# Patient Record
Sex: Male | Born: 1970 | Race: White | Hispanic: No | Marital: Married | Smoking: Never smoker
Health system: Southern US, Community
[De-identification: ages and names within clinical notes are randomized; demographics above are authoritative.]

## PROBLEM LIST (undated history)

## (undated) DIAGNOSIS — T7840XA Allergy, unspecified, initial encounter: Secondary | ICD-10-CM

## (undated) HISTORY — DX: Allergy, unspecified, initial encounter: T78.40XA

---

## 2011-01-13 ENCOUNTER — Encounter: Payer: Self-pay | Admitting: Family Medicine

## 2011-01-13 ENCOUNTER — Ambulatory Visit (INDEPENDENT_AMBULATORY_CARE_PROVIDER_SITE_OTHER): Payer: BC Managed Care – PPO | Admitting: Family Medicine

## 2011-01-13 VITALS — BP 120/82 | HR 74 | Ht 68.0 in | Wt 203.0 lb

## 2011-01-13 DIAGNOSIS — J309 Allergic rhinitis, unspecified: Secondary | ICD-10-CM | POA: Insufficient documentation

## 2011-01-13 DIAGNOSIS — Z0000 Encounter for general adult medical examination without abnormal findings: Secondary | ICD-10-CM

## 2011-01-13 DIAGNOSIS — Z23 Encounter for immunization: Secondary | ICD-10-CM

## 2011-01-13 LAB — CBC WITH DIFFERENTIAL/PLATELET
Basophils Absolute: 0 10*3/uL (ref 0.0–0.1)
Basophils Relative: 0 % (ref 0–1)
MCHC: 35 g/dL (ref 30.0–36.0)
Monocytes Absolute: 0.4 10*3/uL (ref 0.1–1.0)
Neutro Abs: 2.7 10*3/uL (ref 1.7–7.7)
Neutrophils Relative %: 56 % (ref 43–77)
RDW: 12.7 % (ref 11.5–15.5)

## 2011-01-13 LAB — LIPID PANEL
HDL: 46 mg/dL (ref >39)
LDL Cholesterol: 127 mg/dL — ABNORMAL HIGH (ref 0–99)
Total CHOL/HDL Ratio: 4.1 Ratio
VLDL: 15 mg/dL (ref 0–40)

## 2011-01-13 LAB — POCT URINALYSIS DIPSTICK
Leukocytes, UA: NEGATIVE
Nitrite, UA: NEGATIVE
Protein, UA: NEGATIVE
pH, UA: 5

## 2011-01-13 LAB — COMPREHENSIVE METABOLIC PANEL
AST: 26 U/L (ref 0–37)
Albumin: 4.7 g/dL (ref 3.5–5.2)
Alkaline Phosphatase: 59 U/L (ref 39–117)
BUN: 17 mg/dL (ref 6–23)
Potassium: 4.4 mEq/L (ref 3.5–5.3)
Total Bilirubin: 0.6 mg/dL (ref 0.3–1.2)

## 2011-01-13 NOTE — Progress Notes (Signed)
  Subjective:    Patient ID: Keith Andrade, male    DOB: June 13, 1970, 40 y.o.   MRN: [id number]  HPI He is here for a complete examination. He moved to this area within the last year. 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 He does have spring allergies and takes care of this with OTC medications. He has no particular concerns or complaints.   Review of Systems  Constitutional: Negative.   HENT: Negative.   Eyes: Negative.   Respiratory: Negative.   Cardiovascular: Negative.   Gastrointestinal: Negative.   Genitourinary: Negative.   Musculoskeletal: Negative.   Skin: Negative.   Neurological: Negative.   Hematological: Negative.   Psychiatric/Behavioral: Negative.        Objective:   Physical Exam BP 120/82  Pulse 74  Ht 5\' 8"  (1.727 m)  Wt 203 lb (92.08 kg)  BMI 30.87 kg/m2  General Appearance:    Alert, cooperative, no distress, appears stated age  Head:    Normocephalic, without obvious abnormality, atraumatic  Eyes:    PERRL, conjunctiva/corneas clear, EOM's intact, fundi    benign  Ears:    Normal TM's and external ear canals  Nose:   Nares normal, mucosa normal, no drainage or sinus   tenderness  Throat:   Lips, mucosa, and tongue normal; teeth and gums normal  Neck:   Supple, no lymphadenopathy;  thyroid:  no   enlargement/tenderness/nodules; no carotid   bruit or JVD  Back:    Spine nontender, no curvature, ROM normal, no CVA     tenderness  Lungs:     Clear to auscultation bilaterally without wheezes, rales or     ronchi; respirations unlabored  Chest Wall:    No tenderness or deformity   Heart:    Regular rate and rhythm, S1 and S2 normal, no murmur, rub   or gallop  Breast Exam:    No chest wall tenderness, masses or gynecomastia  Abdomen:     Soft, non-tender, nondistended, normoactive bowel sounds,    no masses, no hepatosplenomegaly  Genitalia:    Normal male external genitalia without lesions.  Testicles without masses.  No inguinal hernias.  Rectal:    Normal  sphincter tone, no masses or tenderness; guaiac negative stool.  Prostate smooth, no nodules, not enlarged.  Extremities:   No clubbing, cyanosis or edema  Pulses:   2+ and symmetric all extremities  Skin:   Skin color, texture, turgor normal, no rashes or lesions  Lymph nodes:   Cervical, supraclavicular, and axillary nodes normal  Neurologic:   CNII-XII intact, normal strength, sensation and gait; reflexes 2+ and symmetric throughout          Psych:   Normal mood, affect, hygiene and grooming.    EKG is normal       Assessment & Plan:   1. Physical exam, annual  POCT Urinalysis Dipstick, CBC with Differential, Comprehensive metabolic panel, Lipid panel, Flu vaccine greater than or equal to 3yo with preservative IM, Tdap vaccine greater than or equal to 7yo IM, PR ELECTROCARDIOGRAM, COMPLETE, POCT Hemoccult (POC) Blood/Stool Test  2. Allergic rhinitis, mild     encouraged him to continue to take good care of himself.

## 2011-09-29 ENCOUNTER — Ambulatory Visit
Admission: RE | Admit: 2011-09-29 | Discharge: 2011-09-29 | Disposition: A | Discharge status: Home / Self Care | Payer: BC Managed Care – PPO | Source: Ambulatory Visit | Attending: Family Medicine | Admitting: Family Medicine

## 2011-09-29 ENCOUNTER — Encounter: Payer: Self-pay | Admitting: Family Medicine

## 2011-09-29 ENCOUNTER — Ambulatory Visit (INDEPENDENT_AMBULATORY_CARE_PROVIDER_SITE_OTHER): Payer: BC Managed Care – PPO | Admitting: Family Medicine

## 2011-09-29 VITALS — BP 124/80 | HR 66 | Wt 197.0 lb

## 2011-09-29 DIAGNOSIS — M25532 Pain in left wrist: Secondary | ICD-10-CM

## 2011-09-29 DIAGNOSIS — M25539 Pain in unspecified wrist: Secondary | ICD-10-CM

## 2011-09-29 DIAGNOSIS — M25519 Pain in unspecified shoulder: Secondary | ICD-10-CM

## 2011-09-29 DIAGNOSIS — M25512 Pain in left shoulder: Secondary | ICD-10-CM

## 2011-09-29 NOTE — Progress Notes (Signed)
  Subjective:    Patient ID: Keith Andrade, male    DOB: 03/11/1971, 41 y.o.   MRN: [id number]  HPI Several months ago when he was pulling carpeting he felt a loud pop and pain in his left wrist near the ulnar styloid. He experienced pain for several more weeks. He is continuing to have difficulty with this. He is right-handed but it is interfering with his ability to function and to play golf. He also complains of left anterior shoulder pain. He has no history of injury but did play high school football. He does feel a popping sensation anteriorly.   Review of Systems     Objective:   Physical Exam Left shoulder exam does show limitation of motion especially with abduction and external rotation. Neer's and Hawkins did cause a clicking sensation. He has a questionable positive sulcus sign but no anterior laxity noted. Light tenderness over a.c. Joint. Wrist exam shows full motion with no tenderness and normal strength. X-ray of the shoulder and wrist negative       Assessment & Plan:   1. Left anterior shoulder pain  DG Shoulder Left  2. Left wrist pain  DG Wrist Complete Left   discussed the fact that both of these could easily be surgical in nature and will refer to orthopedics. 25 minutes spent discussing all these issues with him.

## 2011-10-05 ENCOUNTER — Other Ambulatory Visit: Payer: Self-pay | Admitting: Orthopedic Surgery

## 2011-10-05 DIAGNOSIS — M25532 Pain in left wrist: Secondary | ICD-10-CM

## 2011-10-05 DIAGNOSIS — R531 Weakness: Secondary | ICD-10-CM

## 2011-10-11 ENCOUNTER — Other Ambulatory Visit: Payer: BC Managed Care – PPO

## 2011-10-16 ENCOUNTER — Ambulatory Visit
Admission: RE | Admit: 2011-10-16 | Discharge: 2011-10-16 | Disposition: A | Discharge status: Home / Self Care | Payer: BC Managed Care – PPO | Source: Ambulatory Visit | Attending: Orthopedic Surgery | Admitting: Orthopedic Surgery

## 2011-10-16 DIAGNOSIS — M25532 Pain in left wrist: Secondary | ICD-10-CM

## 2011-10-16 DIAGNOSIS — R531 Weakness: Secondary | ICD-10-CM

## 2011-10-16 MED ORDER — IOHEXOL 180 MG/ML  SOLN
1.0000 mL | Freq: Once | INTRAMUSCULAR | Status: AC | PRN
  Administered 2011-10-16: 1 mL via INTRA_ARTICULAR

## 2012-02-23 ENCOUNTER — Encounter: Payer: Self-pay | Admitting: Medical

## 2012-02-23 ENCOUNTER — Ambulatory Visit (INDEPENDENT_AMBULATORY_CARE_PROVIDER_SITE_OTHER): Payer: BC Managed Care – PPO | Admitting: Medical

## 2012-02-23 VITALS — BP 120/80 | Temp 98.3°F | Wt 203.0 lb

## 2012-02-23 DIAGNOSIS — J069 Acute upper respiratory infection, unspecified: Secondary | ICD-10-CM

## 2012-02-23 MED ORDER — AMOXICILLIN 875 MG PO TABS: 875.0000 mg | ORAL_TABLET | Freq: Two times a day (BID) | ORAL | Status: DC

## 2012-02-23 NOTE — Progress Notes (Signed)
Subjective: Here for cough.  Cough 4-5 days, nasal congestion, mild sore throat, coughing thick green morning, flecks of red from sinus drainage.  Using some dayquil with relief.  Denies fever, NVD, ear pain, body aches, chills.  +sick contacts at work.  Rarely sick.  No other aggravating or relieving factors.  No other c/o.   Objective:   Filed Vitals:   02/23/12 1434  BP: 120/80  Temp: 98.3 F (36.8 C)    General appearance: Alert, WD/WN, no distress                             Skin: warm, no rash                           Head: no sinus tenderness                            Eyes: conjunctiva normal, corneas clear, PERRLA                            Ears: pearly TMs, external ear canals normal                          Nose: septum midline, turbinates swollen, with erythema and clear discharge             Mouth/throat: MMM, tongue normal, mild pharyngeal erythema                           Neck: supple, no adenopathy, no thyromegaly, nontender                          Heart: RRR, normal S1, S2, no murmurs                         Lungs: CTA bilaterally, no wheezes, rales, or rhonchi     Assessment and Plan:   Encounter Diagnosis  Name Primary?  . URI (upper respiratory infection) Yes    Discussed diagnosis and treatment of what appears to viral URI, possible early sinusitis.  Suggested symptomatic OTC remedies. Nasal saline spray for congestion.  Tylenol or Ibuprofen OTC for fever and malaise.  If fever begins of if worse by Sunday, then can begin Amoxicillin.   Gave printed script and instructions.  Otherwise c/t to treat this as viral.  Call/return in 2-3 days if symptoms aren't resolving.

## 2012-02-24 ENCOUNTER — Encounter: Payer: Self-pay | Admitting: Medical

## 2013-02-04 ENCOUNTER — Telehealth: Payer: Self-pay | Admitting: Family Medicine

## 2013-02-04 NOTE — Telephone Encounter (Signed)
Called and left pt message jcl wanted him to come in Thursday 02/06/13 i left message of appointment of 9;30 AM IF HE COULD NOT MAKE IT TO [facility] CALL

## 2013-02-04 NOTE — Telephone Encounter (Signed)
,   Not sure what's going on but I doubt it's anything to be worried about. Schedule him to be seen Thursday

## 2013-02-06 ENCOUNTER — Ambulatory Visit: Payer: Self-pay | Admitting: Family Medicine

## 2013-02-07 ENCOUNTER — Encounter: Payer: Self-pay | Admitting: Family Medicine

## 2013-03-07 ENCOUNTER — Encounter: Payer: Self-pay | Admitting: Family Medicine

## 2013-03-07 ENCOUNTER — Ambulatory Visit (INDEPENDENT_AMBULATORY_CARE_PROVIDER_SITE_OTHER): Payer: BC Managed Care – PPO | Admitting: Family Medicine

## 2013-03-07 VITALS — BP 128/82 | HR 83 | Ht 68.5 in | Wt 196.0 lb

## 2013-03-07 DIAGNOSIS — Z0000 Encounter for general adult medical examination without abnormal findings: Secondary | ICD-10-CM

## 2013-03-07 DIAGNOSIS — Z23 Encounter for immunization: Secondary | ICD-10-CM

## 2013-03-07 LAB — POCT URINALYSIS DIPSTICK
Bilirubin, UA: NEGATIVE
Blood, UA: NEGATIVE
Glucose, UA: NEGATIVE
Ketones, UA: NEGATIVE
Leukocytes, UA: NEGATIVE
Nitrite, UA: NEGATIVE
pH, UA: 5

## 2013-03-07 LAB — COMPREHENSIVE METABOLIC PANEL
Albumin: 4.5 g/dL (ref 3.5–5.2)
BUN: 18 mg/dL (ref 6–23)
CO2: 28 mEq/L (ref 19–32)
Calcium: 9.3 mg/dL (ref 8.4–10.5)
Chloride: 100 mEq/L (ref 96–112)
Glucose, Bld: 91 mg/dL (ref 70–99)
Potassium: 4.5 mEq/L (ref 3.5–5.3)
Sodium: 138 mEq/L (ref 135–145)
Total Protein: 7 g/dL (ref 6.0–8.3)

## 2013-03-07 LAB — HEMOCCULT GUIAC POC 1CARD (OFFICE)

## 2013-03-07 LAB — CBC WITH DIFFERENTIAL/PLATELET
HCT: 43.8 % (ref 39.0–52.0)
Hemoglobin: 15.4 g/dL (ref 13.0–17.0)
Lymphs Abs: 1.5 10*3/uL (ref 0.7–4.0)
Monocytes Absolute: 0.5 10*3/uL (ref 0.1–1.0)
Monocytes Relative: 10 % (ref 3–12)
Neutro Abs: 2.6 10*3/uL (ref 1.7–7.7)
Neutrophils Relative %: 55 % (ref 43–77)
RBC: 4.92 MIL/uL (ref 4.22–5.81)

## 2013-03-07 LAB — LIPID PANEL
Cholesterol: 189 mg/dL (ref 0–200)
HDL: 47 mg/dL (ref >39)
LDL Cholesterol: 115 mg/dL — ABNORMAL HIGH (ref 0–99)
Triglycerides: 137 mg/dL (ref <150)

## 2013-03-07 NOTE — Progress Notes (Signed)
   Subjective:    Patient ID: Keith Andrade, male    DOB: May 01, 1970, 42 y.o.   MRN: [id number]  HPI He is here for complete examination. He has no particular concerns or questions. His past medical history is negative except for allergies. He is married with 2 sons. His work is going well. Family and social history were reviewed. He does exercise fairly regularly. Review of Systems  Constitutional: Negative.   HENT: Negative.   Eyes: Negative.   Respiratory: Negative.   Cardiovascular: Negative.   Gastrointestinal: Negative.   Endocrine: Negative.   Genitourinary: Negative.   Allergic/Immunologic: Negative.   Neurological: Negative.        Objective:   Physical Exam BP 128/82  Pulse 83  Ht 5' 8.5" (1.74 m)  Wt 196 lb (88.905 kg)  BMI 29.36 kg/m2  SpO2 99%  General Appearance:    Alert, cooperative, no distress, appears stated age  Head:    Normocephalic, without obvious abnormality, atraumatic  Eyes:    PERRL, conjunctiva/corneas clear, EOM's intact, fundi    benign  Ears:    Normal TM's and external ear canals  Nose:   Nares normal, mucosa normal, no drainage or sinus   tenderness  Throat:   Lips, mucosa, and tongue normal; teeth and gums normal  Neck:   Supple, no lymphadenopathy;  thyroid:  no   enlargement/tenderness/nodules; no carotid   bruit or JVD  Back:    Spine nontender, no curvature, ROM normal, no CVA     tenderness  Lungs:     Clear to auscultation bilaterally without wheezes, rales or     ronchi; respirations unlabored  Chest Wall:    No tenderness or deformity   Heart:    Regular rate and rhythm, S1 and S2 normal, no murmur, rub   or gallop  Breast Exam:    No chest wall tenderness, masses or gynecomastia  Abdomen:     Soft, non-tender, nondistended, normoactive bowel sounds,    no masses, no hepatosplenomegaly  Genitalia:    Normal male external genitalia without lesions.  Testicles without masses.  No inguinal hernias.  Rectal:    Normal sphincter tone,  no masses or tenderness; guaiac negative stool.  Prostate smooth, no nodules, not enlarged.  Extremities:   No clubbing, cyanosis or edema  Pulses:   2+ and symmetric all extremities  Skin:   Skin color, texture, turgor normal, no rashes or lesions  Lymph nodes:   Cervical, supraclavicular, and axillary nodes normal  Neurologic:   CNII-XII intact, normal strength, sensation and gait; reflexes 2+ and symmetric throughout          Psych:   Normal mood, affect, hygiene and grooming.          Assessment & Plan:  Routine general medical examination at a health care facility - Plan: POCT Urinalysis Dipstick, CBC with Differential, Comprehensive metabolic panel, Lipid panel, Hemoccult - 1 Card (office)  Need for prophylactic vaccination and inoculation against influenza - Plan: Flu Vaccine QUAD 36+ mos PF IM (Fluarix)  I encouraged him to continue to take good care of himself.

## 2013-07-11 ENCOUNTER — Ambulatory Visit (INDEPENDENT_AMBULATORY_CARE_PROVIDER_SITE_OTHER): Payer: BC Managed Care – PPO | Admitting: Medical

## 2013-07-11 ENCOUNTER — Encounter: Payer: Self-pay | Admitting: Medical

## 2013-07-11 VITALS — BP 112/80 | HR 72 | Temp 98.3°F | Resp 14 | Wt 195.0 lb

## 2013-07-11 DIAGNOSIS — M20009 Unspecified deformity of unspecified finger(s): Secondary | ICD-10-CM

## 2013-07-11 MED ORDER — CEPHALEXIN 500 MG PO CAPS
500.0000 mg | ORAL_CAPSULE | Freq: Three times a day (TID) | ORAL | Status: DC
Start: 2013-07-11 — End: 2013-08-29

## 2013-07-11 MED ORDER — SILVER SULFADIAZINE 1 % EX CREA: 1.0000 "application " | TOPICAL_CREAM | Freq: Two times a day (BID) | CUTANEOUS | Status: DC

## 2013-07-11 NOTE — Progress Notes (Signed)
Subjective:   Keith Andrade is a 43 y.o. male who presents for evaluation of right index finger wound.  He has a tendency to pick at his cuticles, but he has picked at the right index finger to the point of cause scabbing which has progressed to the PIP over the last few weeks.  It will scab and get red, but not completely healing.  He denies anxiety, denies having a major problem but more of a habit with picking at cuticles, like a nervous tic but mild.   The finger is red, has some open wounds and wants it to heal. Tried Vaseline, other topically creams OTC but not healing.   No prior similar lesion this bad from picking.  No pus, no fever.  Has mild tenderness at the wound.  No other aggravating or relieving factors. No other complaint.  The following portions of the patient's history were reviewed and updated as appropriate: allergies, current medications, past family history, past medical history, past social history and problem list.  Review of Systems As in subjective above   Objective:   Gen: wd, wn, nad Skin: right index finger at PIP with well demarcated 3cm  Including area above and below PIP mostly dorsal and lateral sides of the joint with thickening scabbed skin, pink/red coloration, 2 linear cracks/open wounds, and thickened tissue appearance in general.  No obvious induration, fluctuance warmth or drainage.  No red streaking.   Finger/hand neurovascularly intact MSK: rihgt index finger PIP with mildly reduced ROM due to pain, no obvious swelling, mild pain to palpation of PIP, otherwise fingers/hand nontender, normal ROM, old healing scab of right middle finger cuticle present.   Assessment:   Encounter Diagnosis  Name Primary?  . Deformity, finger acquired Yes      Plan:   Discussed symptoms and exam findings.  Prescribed: Keflex antibiotic to cover for potential infection, begin Silvadene cream for wound healing, discussed wound care, recheck in one week

## 2013-08-29 ENCOUNTER — Encounter: Payer: Self-pay | Admitting: Family Medicine

## 2013-08-29 ENCOUNTER — Ambulatory Visit (INDEPENDENT_AMBULATORY_CARE_PROVIDER_SITE_OTHER): Payer: BC Managed Care – PPO | Admitting: Family Medicine

## 2013-08-29 VITALS — Wt 199.0 lb

## 2013-08-29 DIAGNOSIS — L259 Unspecified contact dermatitis, unspecified cause: Secondary | ICD-10-CM

## 2013-08-29 MED ORDER — TRIAMCINOLONE ACETONIDE 0.5 % EX CREA: 1.0000 "application " | TOPICAL_CREAM | Freq: Three times a day (TID) | CUTANEOUS | Status: DC

## 2013-08-29 NOTE — Progress Notes (Signed)
   Subjective:    Patient ID: Keith Andrade, male    DOB: 11/04/1970, 43 y.o.   MRN: [id number]  HPI He abraded the left flank area with clipping shears while doing yard work. Cause some superficial erythema and they cover the area with bandages. He then developed some erythema in the area of the tape.   Review of Systems     Objective:   Physical Exam Left flank area has well demarcated linear horizontal lesions with some clearing and then erythema in the pattern of the tape.       Assessment & Plan:  Contact dermatitis - Plan: triamcinolone cream (KENALOG) 0.5 %  assortment cool compresses and Benadryl at night. Recommend white cotton against the skin rather than putting any more tape on.

## 2013-08-29 NOTE — Patient Instructions (Signed)
You can use Benadryl at night; pill form. Cold washcloth also helps with itching

## 2013-10-05 IMAGING — CR DG WRIST COMPLETE 3+V*L*
4 series · 4 of 4 positions shown · non-contrast
Comparison: None.

CLINICAL DATA: Pain

LEFT WRIST - COMPLETE 3+ VIEW

[view not recorded (1 of 4)]
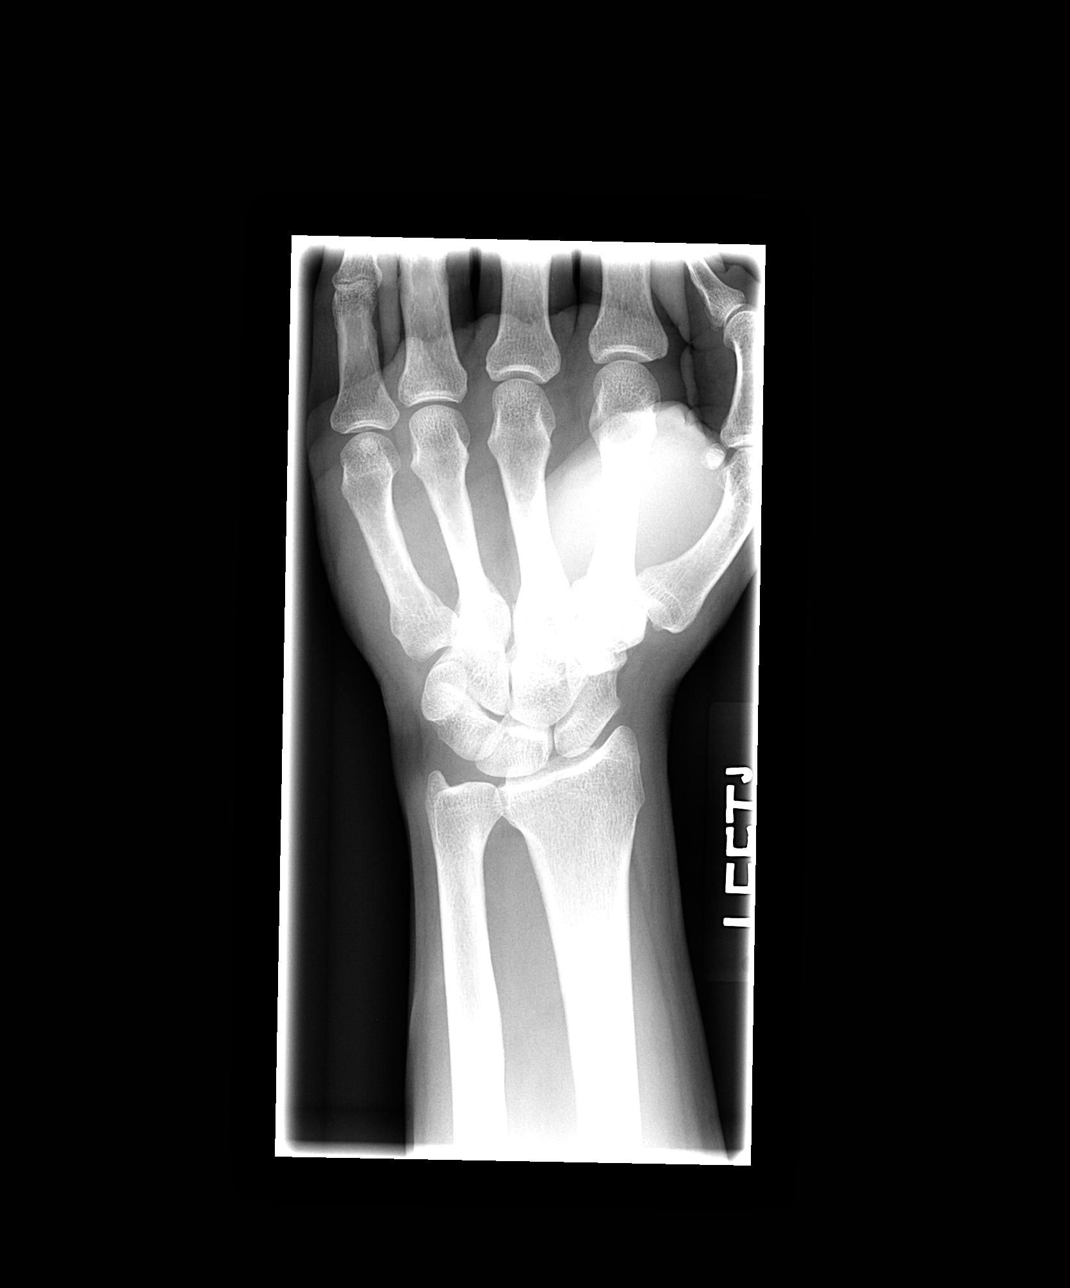

[view not recorded (2 of 4)]
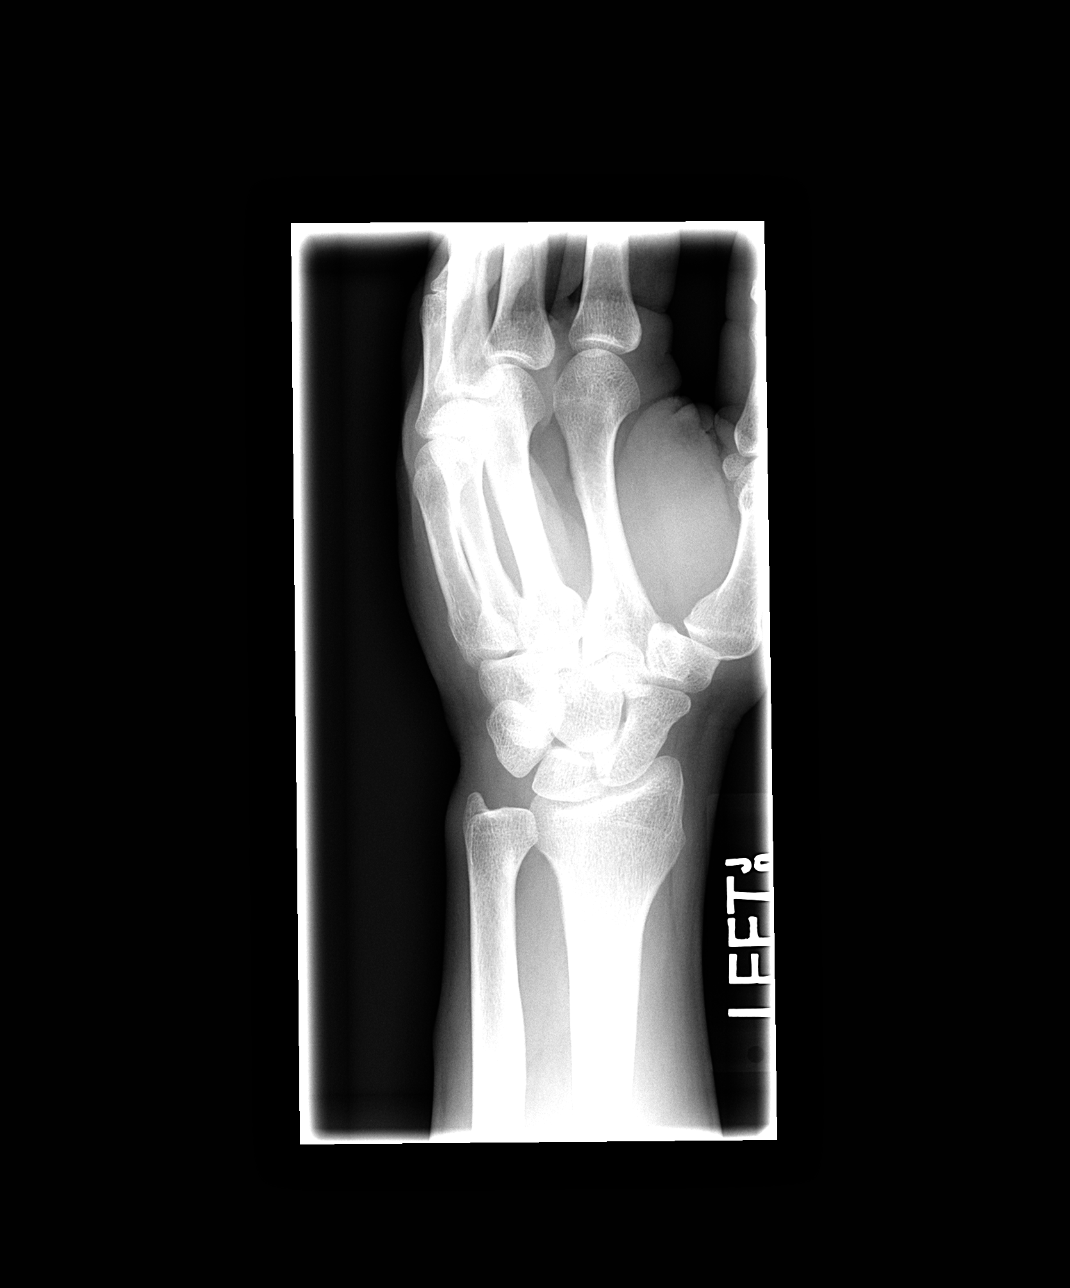

[view not recorded (3 of 4)]
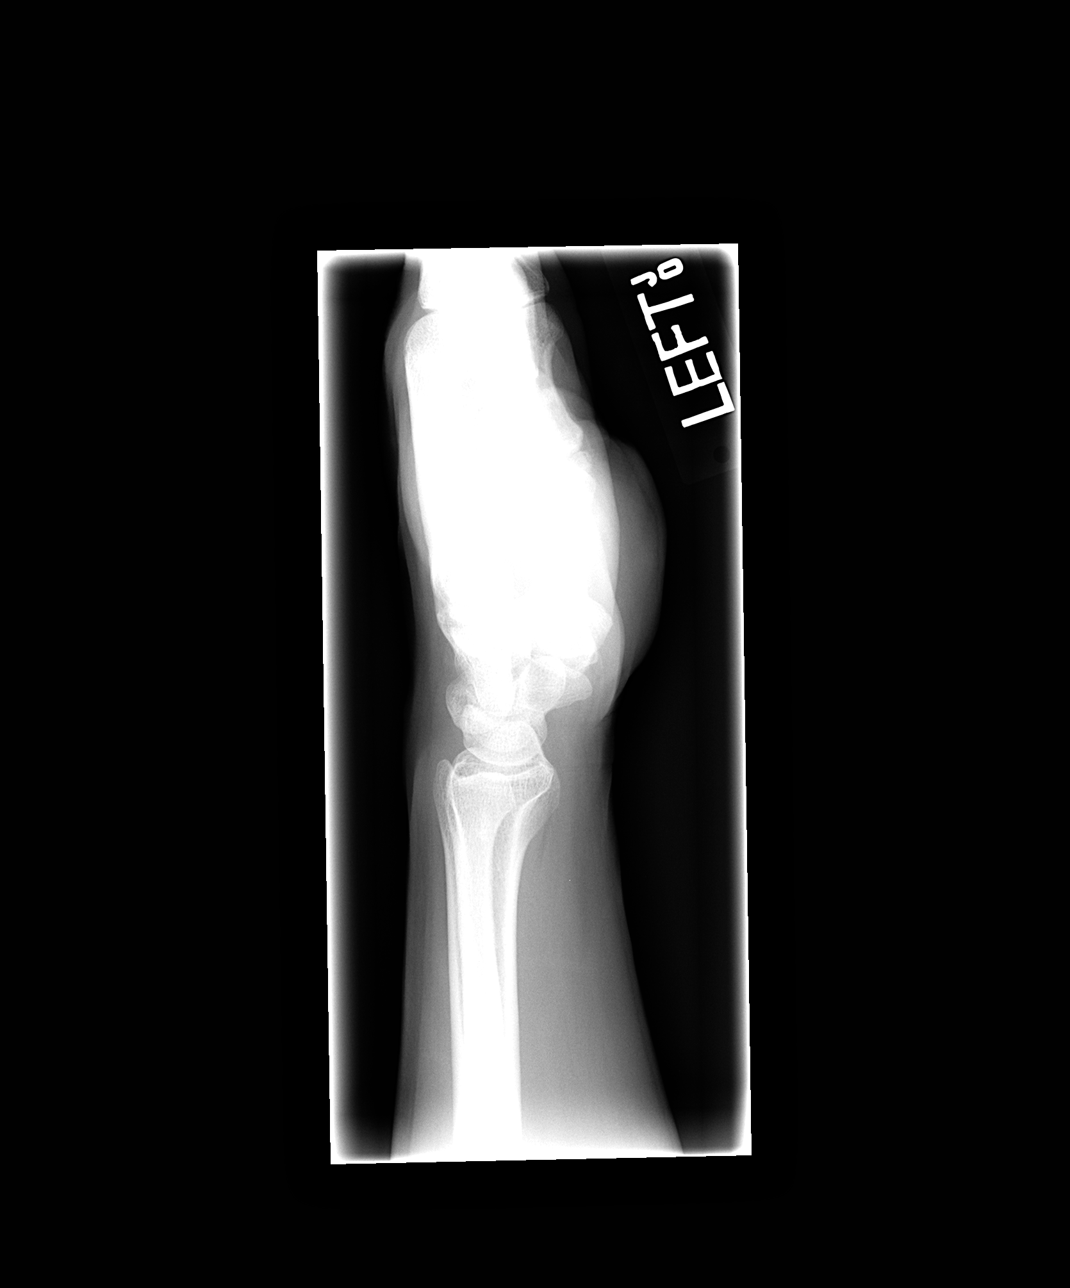

[view not recorded (4 of 4)]
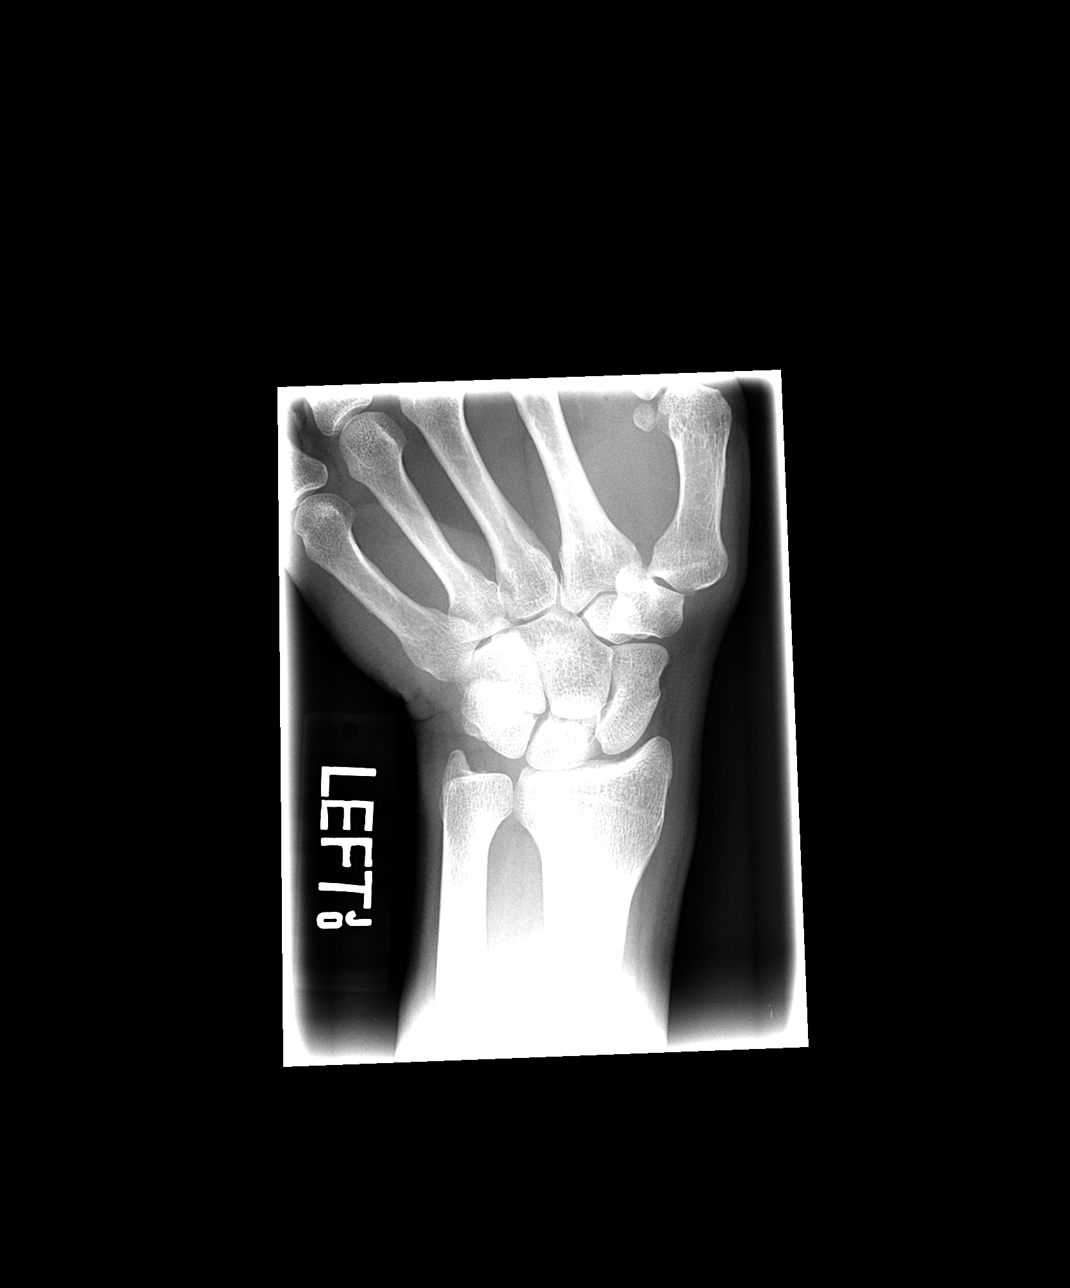

[4 of 4 positions shown; findings below may reference images not displayed]

FINDINGS: There is no evidence of fracture or dislocation.  There
is no evidence of arthropathy or other focal bone abnormality.
Soft tissues are unremarkable.
IMPRESSION: Negative.

## 2014-02-02 ENCOUNTER — Encounter: Payer: Self-pay | Admitting: Medical

## 2014-02-02 ENCOUNTER — Ambulatory Visit (INDEPENDENT_AMBULATORY_CARE_PROVIDER_SITE_OTHER): Payer: BC Managed Care – PPO | Admitting: Medical

## 2014-02-02 VITALS — BP 124/80 | HR 64 | Temp 98.0°F | Resp 16 | Wt 198.0 lb

## 2014-02-02 DIAGNOSIS — F6389 Other impulse disorders: Secondary | ICD-10-CM

## 2014-02-02 DIAGNOSIS — L089 Local infection of the skin and subcutaneous tissue, unspecified: Secondary | ICD-10-CM

## 2014-02-02 DIAGNOSIS — F424 Excoriation (skin-picking) disorder: Secondary | ICD-10-CM

## 2014-02-02 DIAGNOSIS — B9689 Other specified bacterial agents as the cause of diseases classified elsewhere: Secondary | ICD-10-CM

## 2014-02-02 DIAGNOSIS — A499 Bacterial infection, unspecified: Secondary | ICD-10-CM

## 2014-02-02 DIAGNOSIS — L981 Factitial dermatitis: Secondary | ICD-10-CM

## 2014-02-02 MED ORDER — CEPHALEXIN 500 MG PO CAPS: 500.0000 mg | ORAL_CAPSULE | Freq: Three times a day (TID) | ORAL | Status: DC

## 2014-02-02 MED ORDER — SILVER SULFADIAZINE 1 % EX CREA: 1.0000 "application " | TOPICAL_CREAM | Freq: Two times a day (BID) | CUTANEOUS | Status: DC

## 2014-02-02 NOTE — Progress Notes (Signed)
Subjective: Here for skin infection.  I have seen him prior for similar.  He has a habit of picking at his finger cuticles when in deep thought , a nervous habit, although he denies anxiety.  Usually the skin will get irritated and red, sometimes worse to the point of coming in for antibiotic.   Currently since he has been picking at the left thumb for the past 2 weeks, been getting redness, crusting, seems to not be healing, but not worsening.  No other aggravating or relieving factors.  No other c/o.   Past Medical History  Diagnosis Date  . Allergy    ROS as in subjective   Objective: Gen: wd, wn, nad Skin: left thumb from nail bed to MCP dorsally with contiguous patch of erythema, skin breakdown, and some crusting, mildly tender, but no induration or fluctuance  Cap refill and pulses of hand normal Neuro intact   Assessment: Encounter Diagnoses  Name Primary?  . Bacterial skin infection Yes  . Picking own skin     Plan: Begin Keflex antibiotic, silvadene cream topically, discussed wound care, signs/symptoms that would prompt immediate recheck - fever, worse redness, pus drainage, swelling, worse pain.   Advised to use stress ball or something else to occupy his hands.  He declines medication for anxiety or nervous picking.   Discussed preventative measures.  Call/recheck in 1 week, sooner prn.

## 2014-02-26 ENCOUNTER — Encounter: Payer: Self-pay | Admitting: Medical

## 2014-02-26 ENCOUNTER — Ambulatory Visit (INDEPENDENT_AMBULATORY_CARE_PROVIDER_SITE_OTHER): Payer: BC Managed Care – PPO | Admitting: Medical

## 2014-02-26 DIAGNOSIS — F424 Excoriation (skin-picking) disorder: Secondary | ICD-10-CM

## 2014-02-26 DIAGNOSIS — L089 Local infection of the skin and subcutaneous tissue, unspecified: Secondary | ICD-10-CM

## 2014-02-26 DIAGNOSIS — L981 Factitial dermatitis: Secondary | ICD-10-CM

## 2014-02-26 DIAGNOSIS — A499 Bacterial infection, unspecified: Secondary | ICD-10-CM

## 2014-02-26 DIAGNOSIS — B9689 Other specified bacterial agents as the cause of diseases classified elsewhere: Secondary | ICD-10-CM

## 2014-02-26 DIAGNOSIS — F6389 Other impulse disorders: Secondary | ICD-10-CM

## 2014-02-26 MED ORDER — AMOXICILLIN-POT CLAVULANATE 875-125 MG PO TABS: 1.0000 | ORAL_TABLET | Freq: Two times a day (BID) | ORAL | Status: DC

## 2014-02-26 NOTE — Progress Notes (Signed)
Subjective: Here for recheck on skin infection.  I saw him 02/02/14 for same.  He has a habit of picking at his finger cuticles when in deep thought , a nervous habit, although he denies anxiety.  Usually the skin will get irritated and red, sometimes worse to the point of coming in for antibiotic.   Since last visit used Keflex oral, topical silvadene, and not really clearing up.  No other aggravating or relieving factors.  No other c/o.   Past Medical History  Diagnosis Date  . Allergy    ROS as in subjective   Objective: Gen: wd, wn, nad Skin: left thumb from nail bed to MCP dorsally with contiguous patch of erythema, skin breakdown, and some crusting, mildly tender, but no induration or fluctuance  Cap refill and pulses of hand normal Neuro intact   Assessment: Encounter Diagnoses  Name Primary?  . Bacterial skin infection Yes  . Picking own skin     Plan: Reviewed case with Dr. Susann GivensLalonde supervising physical who also examined him.  Begin Augmentin oral, he will use the Bacitracin he has at home topically, discussed wound care, signs/symptoms that would prompt immediate recheck - fever, worse redness, pus drainage, swelling, worse pain.   Discussed preventative measures.    Call/recheck in 1 week, sooner prn.

## 2014-03-05 ENCOUNTER — Telehealth: Payer: Self-pay | Admitting: Medical

## 2014-03-05 ENCOUNTER — Encounter: Payer: Self-pay | Admitting: Medical

## 2014-03-05 ENCOUNTER — Ambulatory Visit (INDEPENDENT_AMBULATORY_CARE_PROVIDER_SITE_OTHER): Payer: BC Managed Care – PPO | Admitting: Medical

## 2014-03-05 VITALS — BP 110/80 | HR 72 | Temp 98.4°F | Resp 16 | Wt 200.0 lb

## 2014-03-05 DIAGNOSIS — L989 Disorder of the skin and subcutaneous tissue, unspecified: Secondary | ICD-10-CM

## 2014-03-05 DIAGNOSIS — Q740 Other congenital malformations of upper limb(s), including shoulder girdle: Secondary | ICD-10-CM

## 2014-03-05 NOTE — Telephone Encounter (Signed)
Refer to dermatology ASAP within the next few days for nonhealing skin lesion.

## 2014-03-05 NOTE — Progress Notes (Signed)
Subjective: Here for recheck on skin infection.  I saw him 02/02/14 and last week for same.  He has a habit of picking at his finger cuticles when in deep thought , a nervous habit, although he denies anxiety.  Usually the skin will get irritated and red, sometimes worse to the point of coming in for antibiotic.   Since last visit still using the Augmentin.  It is now less puffy and red, starting to scab over, but not majorly improved.  Bacitracin ointment OTC actually made it flare up red so he stopped this.  No other aggravating or relieving factors.  No other c/o.   Past Medical History  Diagnosis Date  . Allergy    ROS as in subjective   Objective: Gen: wd, wn, nad Skin: left thumb from nail bed to MCP dorsally with contiguous patch of erythema, skin breakdown, and some crusting, mildly tender, but no induration or fluctuance, less swelling and erythema than last visit.  Cap refill and pulses of hand normal Neuro intact   Assessment: Encounter Diagnoses  Name Primary?  . Thumb anomaly Yes  . Skin lesion     Plan: Reviewed case with Dr. Susann GivensLalonde supervising physical who also examined him.   Although similar to lesion he had on opposite thumb in the spring that did resolve with our efforts, this lesion is not resolving despite 1 round of Keflex oral and topical Silvadene, and second round of Augmentin oral.  Referral to dermatology.

## 2014-03-06 ENCOUNTER — Other Ambulatory Visit: Payer: Self-pay | Admitting: Family Medicine

## 2014-03-06 DIAGNOSIS — L989 Disorder of the skin and subcutaneous tissue, unspecified: Secondary | ICD-10-CM

## 2014-03-06 NOTE — Telephone Encounter (Signed)
Patient's wife is aware of the appointment to see dermatology on 03/11/14 @ 1000am with Dr. Charlton HawsMcConnell. [facility]

## 2015-03-24 ENCOUNTER — Telehealth: Payer: Self-pay

## 2015-03-25 ENCOUNTER — Ambulatory Visit (INDEPENDENT_AMBULATORY_CARE_PROVIDER_SITE_OTHER): Payer: BC Managed Care – PPO | Admitting: Family Medicine

## 2015-03-25 ENCOUNTER — Encounter: Payer: Self-pay | Admitting: Family Medicine

## 2015-03-25 VITALS — BP 140/88 | HR 66 | Ht 68.5 in | Wt 201.0 lb

## 2015-03-25 DIAGNOSIS — L290 Pruritus ani: Secondary | ICD-10-CM

## 2015-03-25 LAB — HEMOCCULT GUIAC POC 1CARD (OFFICE)

## 2015-03-25 NOTE — Patient Instructions (Signed)
Normal cleaning for the area and also in the shower pull cheeks apart let the water run over the area. Antibiotic ointment to the area.

## 2015-03-25 NOTE — Progress Notes (Signed)
   Subjective:    Patient ID: Keith Andrade, male    DOB: 02/22/1971, 45 y.o.   MRN: [id number]  HPI He has a three-week history of difficulty with anal itching and slight tenderness. He has not felt any lumps in the area. His BMs have been normal. He has tried OTC medications including Preparation H and witch hazel.   Review of Systems     Objective:   Physical Exam Anal exam shows no hemorrhoid or fissure. He does have some slight erythema and fissuring at the 12:00 position centimeter or so above the anus.       Assessment & Plan:  Pruritus ani  recommend conservative care with gentle cleansing of the area, use of antibiotic ointment. If continued difficulty is to call me.

## 2015-03-25 NOTE — Progress Notes (Signed)
   Subjective:    Patient ID: Keith Andrade, male    DOB: 04/01/1970, 45 y.o.   MRN: [id number]  HPI    Review of Systems     Objective:   Physical Exam        Assessment & Plan:  Digital rectal exam showed no lesions and quiet was negative.

## 2015-03-25 NOTE — Addendum Note (Signed)
Addended by: Ronnald NianLALONDE, Samariya Rockhold C on: 03/25/2015 10:11 AM   Modules accepted: Orders

## 2015-03-26 NOTE — Telephone Encounter (Signed)
error 

## 2015-03-31 ENCOUNTER — Telehealth: Payer: Self-pay | Admitting: Family Medicine

## 2015-03-31 ENCOUNTER — Telehealth: Payer: Self-pay

## 2015-03-31 DIAGNOSIS — L290 Pruritus ani: Secondary | ICD-10-CM

## 2015-03-31 MED ORDER — LIDOCAINE 5 % EX OINT: 1.0000 "application " | TOPICAL_OINTMENT | CUTANEOUS | Status: DC | PRN

## 2015-03-31 NOTE — Telephone Encounter (Signed)
Referral to GI done.  Left message advising patient referral has been placed.

## 2015-03-31 NOTE — Telephone Encounter (Signed)
Refer to GI 

## 2015-03-31 NOTE — Telephone Encounter (Signed)
Patient called requesting something for his discomfort.  He says he does not think he can wait until his referral is made.  Please advise.

## 2015-03-31 NOTE — Telephone Encounter (Signed)
Pt says he has not had any improvement with anal issue. He has tried everything that Dr Susann GivensLalonde suggested. Is it time for the GI referral that Dr Susann GivensLalonde mentioned would be the next step?

## 2015-03-31 NOTE — Telephone Encounter (Signed)
Let him know that I called something in 

## 2015-03-31 NOTE — Telephone Encounter (Signed)
He is scheduled to see GI for some anal discomfort. I will: Xylocaine to be used topically

## 2015-03-31 NOTE — Telephone Encounter (Signed)
Patient aware lidocaine has been sent to pharmacy.

## 2015-04-02 ENCOUNTER — Telehealth: Payer: Self-pay | Admitting: Gastroenterology

## 2015-04-02 NOTE — Telephone Encounter (Signed)
Patient is scheduled for 04/16/15 with Lawson FiscalLori Hvozdovic, PA He is advised to try sitz baths, gynelotrimin cream, and revtiv as needed.  He will be added to the cancellation list

## 2015-04-06 ENCOUNTER — Telehealth: Payer: Self-pay | Admitting: Family Medicine

## 2015-04-06 NOTE — Telephone Encounter (Signed)
Pt called stating he is absolutely miserable, can't sleep, has tried everything we have suggested and nothing helping.  He has called [facility] and they can't get him in any sooner but he is on the cancellation list.  He said he doesn't know what to do and that he needs help.  I called [facility] t# [phone] and Misty Stanley will work him in today, faxed office notes. Called pt & he will go now.

## 2015-04-08 NOTE — Telephone Encounter (Signed)
Left message for pt, need to know if he cancelled the [facility] appt, or if we need to

## 2015-04-08 NOTE — Telephone Encounter (Signed)
Pt state he has not cancelled appt and asked that I cancel it.  I called [facility] and cancelled appt

## 2015-04-14 ENCOUNTER — Telehealth: Payer: Self-pay | Admitting: Family Medicine

## 2015-04-14 NOTE — Telephone Encounter (Signed)
Refer to dermatology ;get one ASAP

## 2015-04-14 NOTE — Telephone Encounter (Signed)
Pt called and states that he went to the GI dr for the anal discomfort and they said he need to go to the dermatologist for the issue. He needs a referral, for a dermatologist. He would like one as soon as possible, says he is not sleeping for the problem he is having,

## 2015-04-14 NOTE — Telephone Encounter (Signed)
Called patient about dermatologist appointment.  Tues Feb 7th @ 845am [facility] w/ deana Dareen Piano

## 2015-04-15 NOTE — Telephone Encounter (Signed)
Pt is scheduled tomorrow with PA English Black @ 10:00am on 04/16/15. Pt is aware

## 2015-04-15 NOTE — Telephone Encounter (Signed)
Pt called and is still having pain and can not wait till feb the 7th. Wants something sooner than then, he is not sleeping, said he needs a appt now,told him we would try our best to see what we could do,

## 2015-04-16 ENCOUNTER — Ambulatory Visit: Payer: BC Managed Care – PPO | Admitting: Physician Assistant

## 2015-04-30 ENCOUNTER — Telehealth: Payer: Self-pay | Admitting: Family Medicine

## 2015-04-30 NOTE — Telephone Encounter (Signed)
Pt has seen Dr Loreta Ave & Dr Terri Piedra. Dr Terri Piedra dx'd him with a staph infection & pt has been on 2 rounds of Amox Clav antibiotic. Now pt is having flu like symptoms that started Wednesday night,  2-3 days after being on the 2nd round of antibiotic. Pt has sore throat, lack of energy. He mainly wants to make sure that Dr Susann Givens is up to date on what pt was dx's with to ensure he is being properly treated. Pt wants to know if flu like symptoms could be related to his diagnosis and/or meds.

## 2015-04-30 NOTE — Telephone Encounter (Signed)
Its hard to say without seeing him.   The most common side effects of Augmentin is diarrhea, so if he is not having this but actually having sore throat/fatigue, may be viral illness/unrelated.     I would recommend good hydration, rest, can use Tylenol for aches/fever, consider probiotic or yogurt while on Augmentin.   If symptoms persist or worsen, or if need be to clarify, then come in for eval.

## 2015-04-30 NOTE — Telephone Encounter (Signed)
Pt is aware.  

## 2015-05-03 NOTE — Telephone Encounter (Signed)
Pt states no better, staph infection, sore throat & congestion.  Are you ok with course of treatment, finished one round of antibiotic and now on second round antibiotics, 4 days in and no better.  Please let pt know recommendation, said Dr. Terri Piedra was to forward staph infection results to you.  Pt is very concerned about this and wants to know what you think.

## 2015-06-28 ENCOUNTER — Telehealth: Payer: Self-pay | Admitting: Family Medicine

## 2015-06-28 NOTE — Telephone Encounter (Signed)
Pt called back and states he is no longer using this medication

## 2015-06-28 NOTE — Telephone Encounter (Signed)
Recv'd notice from [facility] stating Lidocaine subject to quantity limits, left message for pt to see if he is still using this and if so will need to do quantity limits exception

## 2016-02-15 ENCOUNTER — Encounter: Payer: Self-pay | Admitting: Family Medicine

## 2016-02-15 ENCOUNTER — Ambulatory Visit (INDEPENDENT_AMBULATORY_CARE_PROVIDER_SITE_OTHER): Payer: BC Managed Care – PPO | Admitting: Family Medicine

## 2016-02-15 VITALS — BP 118/78 | HR 70 | Wt 203.0 lb

## 2016-02-15 DIAGNOSIS — J3489 Other specified disorders of nose and nasal sinuses: Secondary | ICD-10-CM

## 2016-02-15 DIAGNOSIS — L309 Dermatitis, unspecified: Secondary | ICD-10-CM | POA: Diagnosis not present

## 2016-02-15 NOTE — Progress Notes (Signed)
   Subjective:    Patient ID: Keith Andrade, male    DOB: 10/22/1970, 45 y.o.   MRN: [id number]  HPI He is here for evaluation of skin problems. He has a lesion on the tip of his nose that he has had for the last several months. He is also noted some slight dryness on the dorsal surface of his hands and behind both knees.   Review of Systems     Objective:   Physical Exam Slight dryness is noted on the tip of the nose with some minor skin change with that. He also has some erythematous dry small patches on the dorsal surface of his hand and behind both knees.       Assessment & Plan:  Eczema, unspecified type  Nasal lesion Lesions on his hands and knees appear to be eczematous. Lesion on his nose could be a possible early AK. I will refer to dermatology mainly for the nasal lesion. Recommend cortisone cream for the hand and knee problem and send to dermatology.

## 2017-01-23 ENCOUNTER — Encounter: Payer: Self-pay | Admitting: Family Medicine

## 2017-01-23 ENCOUNTER — Ambulatory Visit (INDEPENDENT_AMBULATORY_CARE_PROVIDER_SITE_OTHER): Payer: BC Managed Care – PPO | Admitting: Family Medicine

## 2017-01-23 VITALS — BP 130/80 | HR 76 | Resp 18 | Ht 69.0 in | Wt 199.2 lb

## 2017-01-23 DIAGNOSIS — Z0000 Encounter for general adult medical examination without abnormal findings: Secondary | ICD-10-CM

## 2017-01-23 DIAGNOSIS — J309 Allergic rhinitis, unspecified: Secondary | ICD-10-CM | POA: Diagnosis not present

## 2017-01-23 DIAGNOSIS — L309 Dermatitis, unspecified: Secondary | ICD-10-CM | POA: Diagnosis not present

## 2017-01-23 DIAGNOSIS — M7711 Lateral epicondylitis, right elbow: Secondary | ICD-10-CM | POA: Diagnosis not present

## 2017-01-23 LAB — POCT URINALYSIS DIP (PROADVANTAGE DEVICE)
BILIRUBIN UA: NEGATIVE
Glucose, UA: NEGATIVE mg/dL
Ketones, POC UA: NEGATIVE mg/dL
Leukocytes, UA: NEGATIVE
NITRITE UA: NEGATIVE
Protein Ur, POC: NEGATIVE mg/dL
RBC UA: NEGATIVE
SPECIFIC GRAVITY, URINE: 1.01
UUROB: NEGATIVE
pH, UA: 7.5 (ref 5.0–8.0)
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
## 2017-01-24 ENCOUNTER — Telehealth: Payer: Self-pay | Admitting: Family Medicine

## 2017-01-24 LAB — LIPID PANEL
CHOLESTEROL: 225 mg/dL — AB (ref <200)
HDL: 60 mg/dL (ref >40)
LDL Cholesterol (Calc): 140 mg/dL (calc) — ABNORMAL HIGH
Non-HDL Cholesterol (Calc): 165 mg/dL (calc) — ABNORMAL HIGH (ref <130)
Total CHOL/HDL Ratio: 3.8 (calc) (ref <5.0)
Triglycerides: 129 mg/dL (ref <150)

## 2017-01-24 LAB — COMPREHENSIVE METABOLIC PANEL
AG Ratio: 1.5 (calc) (ref 1.0–2.5)
ALBUMIN MSPROF: 4.6 g/dL (ref 3.6–5.1)
ALKALINE PHOSPHATASE (APISO): 77 U/L (ref 40–115)
ALT: 34 U/L (ref 9–46)
AST: 27 U/L (ref 10–40)
BILIRUBIN TOTAL: 0.6 mg/dL (ref 0.2–1.2)
BUN: 12 mg/dL (ref 7–25)
CALCIUM: 9.9 mg/dL (ref 8.6–10.3)
CHLORIDE: 101 mmol/L (ref 98–110)
CO2: 29 mmol/L (ref 20–32)
Creat: 0.91 mg/dL (ref 0.60–1.35)
Globulin: 3.1 g/dL (calc) (ref 1.9–3.7)
Glucose, Bld: 93 mg/dL (ref 65–99)
POTASSIUM: 4.3 mmol/L (ref 3.5–5.3)
SODIUM: 138 mmol/L (ref 135–146)
TOTAL PROTEIN: 7.7 g/dL (ref 6.1–8.1)

## 2017-01-24 LAB — CBC WITH DIFFERENTIAL/PLATELET
BASOS PCT: 0.7 %
Basophils Absolute: 38 cells/uL (ref 0–200)
EOS ABS: 130 {cells}/uL (ref 15–500)
Eosinophils Relative: 2.4 %
HCT: 44.2 % (ref 38.5–50.0)
Hemoglobin: 15.6 g/dL (ref 13.2–17.1)
Lymphs Abs: 1674 cells/uL (ref 850–3900)
MCH: 31.1 pg (ref 27.0–33.0)
MCHC: 35.3 g/dL (ref 32.0–36.0)
MCV: 88.2 fL (ref 80.0–100.0)
MONOS PCT: 11 %
MPV: 10.5 fL (ref 7.5–12.5)
NEUTROS PCT: 54.9 %
Neutro Abs: 2965 cells/uL (ref 1500–7800)
PLATELETS: 250 10*3/uL (ref 140–400)
RBC: 5.01 10*6/uL (ref 4.20–5.80)
RDW: 12.1 % (ref 11.0–15.0)
TOTAL LYMPHOCYTE: 31 %
WBC: 5.4 10*3/uL (ref 3.8–10.8)
WBCMIX: 594 {cells}/uL (ref 200–950)

## 2017-01-24 NOTE — Telephone Encounter (Signed)
Pt informed of lab results. 

## 2017-01-30 ENCOUNTER — Other Ambulatory Visit (INDEPENDENT_AMBULATORY_CARE_PROVIDER_SITE_OTHER): Payer: BC Managed Care – PPO

## 2017-01-30 DIAGNOSIS — Z1211 Encounter for screening for malignant neoplasm of colon: Secondary | ICD-10-CM

## 2017-01-30 LAB — HEMOCCULT GUIAC POC 1CARD (OFFICE)
Card #2 Fecal Occult Blod, POC: NEGATIVE
FECAL OCCULT BLD: NEGATIVE
FECAL OCCULT BLD: NEGATIVE

## 2017-12-13 ENCOUNTER — Encounter: Payer: Self-pay | Admitting: Family Medicine

## 2017-12-13 ENCOUNTER — Ambulatory Visit: Payer: BC Managed Care – PPO | Admitting: Family Medicine

## 2017-12-13 VITALS — BP 140/90 | HR 72 | Temp 98.0°F | Ht 69.0 in | Wt 198.6 lb

## 2017-12-13 DIAGNOSIS — L309 Dermatitis, unspecified: Secondary | ICD-10-CM | POA: Diagnosis not present

## 2017-12-13 DIAGNOSIS — Z23 Encounter for immunization: Secondary | ICD-10-CM

## 2017-12-13 DIAGNOSIS — L719 Rosacea, unspecified: Secondary | ICD-10-CM | POA: Diagnosis not present

## 2017-12-13 MED ORDER — METRONIDAZOLE 0.75 % EX CREA: TOPICAL_CREAM | Freq: Two times a day (BID) | CUTANEOUS | 0 refills | Status: AC

## 2017-12-13 MED ORDER — TRIAMCINOLONE ACETONIDE 0.1 % EX CREA: 1.0000 "application " | TOPICAL_CREAM | Freq: Two times a day (BID) | CUTANEOUS | 1 refills | Status: AC | PRN

## 2017-12-13 NOTE — Patient Instructions (Signed)
Use the metrocream twice daily to your nose.  If you aren't seeing any improvement after 4-6 weeks, see a dermatologist.  I gave you triamcinolone cream which is the steroid for your eczema.   Rosacea Rosacea is a long-term (chronic) condition that affects the skin of the face, including the cheeks, nose, brow, and chin. This condition can also affect the eyes. Rosacea causes blood vessels near the surface of the skin to get bigger (be enlarged), and that makes the skin red. There is no cure for this condition, but treatment can help to control your symptoms. Follow these instructions at home: Skin Care Take care of your skin as told by your doctor. Your doctor may tell you do these things:  Wash your skin gently two or more times each day.  Use mild soap.  Use a sunscreen or sunblock with SPF 30 or greater.  Use gentle cosmetics that are meant for sensitive skin.  Shave with an electric shaver instead of a blade.  Lifestyle  Try to keep track of what foods trigger this condition. Avoid any triggers. These may include: ? Spicy foods. ? Seafood. ? Cheese. ? Hot liquids. ? Nuts. ? Chocolate. ? Iodized salt.  Do not drink alcohol.  Avoid extremely cold or hot temperatures.  Try to reduce your stress. If you need help to do this, talk with your doctor.  When you exercise, do these things to stay cool: ? Limit your sun exposure. ? Use a fan. ? Exercise for a shorter time, and exercise more often. General instructions  Keep all follow-up visits as told by your doctor. This is important.  Take over-the-counter and prescription medicines only as told by your doctor.  If your eyelids are affected, press warm compresses to them. Do this as told by your doctor.  If you were prescribed an antibiotic medicine, apply or take it as told by your doctor. Do not stop using the antibiotic even if your condition improves. Contact a doctor if:  Your symptoms get worse.  Your  symptoms do not improve after two months of treatment.  You have new symptoms.  You have any changes in vision or you have problems with your eyes, such as redness or itching.  You feel very sad (depressed).  You lose your appetite.  You have trouble concentrating. This information is not intended to replace advice given to you by your health care provider. Make sure you discuss any questions you have with your health care provider. Document Released: 05/29/2011 Document Revised: 08/12/2015 Document Reviewed: 05/13/2014 Elsevier Interactive Patient Education  [employer].

## 2017-12-13 NOTE — Progress Notes (Signed)
Chief Complaint  Patient presents with  . Rash    on nose, saw Dr.Lalonde and he gave him some steroid cream last yeat that he has been using on and off over the last year-feel like it's not really getting any better. Wants to make sure this is not anything he should be concerned about.   . Hair/Scalp Problem    also has been scalp itching-has been using TFAL shampoo and not really helping.    Moving to work at [employer] [place] in the next couple of weeks.  Some stress, so not surprised BP is up. No h/o elevated BP's in past per pt. Since he is moving, wanted to get some of these skin concerns looked at, before moving out of state.  He has h/o eczema, and currently has patches of eczema on his right wrist, behind left knee. Comes/goes.  Out of steroid creams.  He is also presenting for re-evaluation of spots on his nose. He reports he addressed this in the past with Dr. Susann Givens, was given prescription cream which he believes was a steroid cream (nothing documented in notes/meds).  He didn't notice any improvement.  The lesions remain limited to the nose, not spreading. He denies flushing, acne, or other skin lesions.    He does report some itching at the crown of his scalp.  It is sporadic, just mild now. He tried T-Fal shampoo without any improvement.  He is concerned because he can't see the area, and recalls having many sunburns in that balding area in the past. Denies any flaking or dandruff.  PMH, PSH, SH reviewed and updated  Current Outpatient Medications on File Prior to Visit  Medication Sig Dispense Refill  . Multiple Vitamins-Minerals (MULTIVITAMIN WITH MINERALS) tablet Take 1 tablet by mouth daily.     No current facility-administered medications on file prior to visit.    No Known Allergies  ROS: no fever, chills, URI symptoms, headaches, bleeding, bruising, or other rashes. See HPI  PHYSICAL EXAM:  BP 140/90   Pulse 72   Temp 98 F (36.7 C)   Ht 5\' 9"  (1.753 m)    Wt 198 lb 9.6 oz (90.1 kg)   BMI 29.33 kg/m    Well-appearing, pleasant male, in no distress HEENT: conjunctiva and sclera are clear, EOMI Skin: nose: Scattered small,mildly erythematous papules vs pustules on the tip of the nose extending tot he left side.  Some have small telangiectasias. Remainder of skin is clear. Scalp appears entirely normal. Male pattern baldness. No flaking or lesions. Right hand/wrist--small erythematous patch that is slightly dry   ASSESSMENT/PLAN:  Eczema, unspecified type - discussed moisturizing, and TAC use prn. proper use and risks reviewed in detail - Plan: triamcinolone cream (KENALOG) 0.1 %  Need for influenza vaccination - Plan: Flu Vaccine QUAD 6+ mos PF IM (Fluarix Quad PF)  Rosacea, unspecified - this is suspected diagnosis, regarding the bumps on the tip of nose. no rhinophyma noted. Trial of metronidazole cream. fu with derm if not improving. - Plan: metroNIDAZOLE (METROCREAM) 0.75 % cream    Suspect poss rosacea. Treat with metrocream. No doxy due to moving to [place] (sun sensitivity; also attending a bachelor party in [place] in near future).  Cannot r/o BCC. If not improving over the next 1-2 months, see derm in [place]   TAC 0.1% cream for eczema. Proper use reviewed in detail.
